# Patient Record
Sex: Female | Born: 1995 | Race: Black or African American | Hispanic: No | Smoking: Never smoker
Health system: Southern US, Community
[De-identification: ages and names within clinical notes are randomized; demographics above are authoritative.]

---

## 2019-12-14 ENCOUNTER — Other Ambulatory Visit: Payer: Self-pay

## 2019-12-14 ENCOUNTER — Ambulatory Visit (HOSPITAL_COMMUNITY)
Admission: EM | Admit: 2019-12-14 | Discharge: 2019-12-14 | Disposition: A | Discharge status: Home / Self Care | Payer: 59 | Attending: Family Medicine | Admitting: Family Medicine

## 2019-12-14 ENCOUNTER — Encounter (HOSPITAL_COMMUNITY): Payer: Self-pay

## 2019-12-14 DIAGNOSIS — R519 Headache, unspecified: Secondary | ICD-10-CM | POA: Insufficient documentation

## 2019-12-14 DIAGNOSIS — R42 Dizziness and giddiness: Secondary | ICD-10-CM | POA: Diagnosis present

## 2019-12-14 LAB — CBC
HCT: 40.8 % (ref 36.0–46.0)
Hemoglobin: 13.2 g/dL (ref 12.0–15.0)
MCH: 29 pg (ref 26.0–34.0)
MCHC: 32.4 g/dL (ref 30.0–36.0)
MCV: 89.7 fL (ref 80.0–100.0)
Platelets: 293 10*3/uL (ref 150–400)
RBC: 4.55 MIL/uL (ref 3.87–5.11)
RDW: 13 % (ref 11.5–15.5)
WBC: 10.2 10*3/uL (ref 4.0–10.5)
nRBC: 0 % (ref 0.0–0.2)

## 2019-12-14 MED ORDER — IBUPROFEN 800 MG PO TABS
ORAL_TABLET | ORAL | Status: AC
  Filled 2019-12-14: qty 1

## 2019-12-14 MED ORDER — ONDANSETRON 4 MG PO TBDP
ORAL_TABLET | ORAL | Status: AC
  Filled 2019-12-14: qty 1

## 2019-12-14 MED ORDER — IBUPROFEN 800 MG PO TABS
800.0000 mg | ORAL_TABLET | Freq: Once | ORAL | Status: AC
  Administered 2019-12-14: 800 mg via ORAL

## 2019-12-14 MED ORDER — ONDANSETRON 4 MG PO TBDP
4.0000 mg | ORAL_TABLET | Freq: Once | ORAL | Status: AC
  Administered 2019-12-14: 4 mg via ORAL

## 2019-12-14 MED ORDER — ONDANSETRON 4 MG PO TBDP: 4.0000 mg | ORAL_TABLET | Freq: Three times a day (TID) | ORAL | 0 refills | Status: AC | PRN

## 2019-12-14 MED ORDER — BUTALBITAL-APAP-CAFFEINE 50-325-40 MG PO TABS: 1.0000 | ORAL_TABLET | Freq: Four times a day (QID) | ORAL | 0 refills | Status: AC | PRN

## 2019-12-14 NOTE — Discharge Instructions (Signed)
Change in sleep hygeine, diet, activity habits can trigger headaches.  Zofran every 8 hours as needed for nausea or vomiting.   Fioriect as needed for severe headache, may cause drowsiness be mindful of this.  We will check your blood levels to ensure anemia isn't contributing to your lightheadedness. We will call you if this results concerning.  Drink plenty of water to ensure adequate hydration.  Please follow up with a primary care provider as you may need further evaluation if your headache's persist.

## 2019-12-14 NOTE — ED Provider Notes (Signed)
Christina Chaney    CSN: [id number] Arrival date & time: 12/14/19  1225      History   Chief Complaint Chief Complaint  Patient presents with  . Headache    HPI Christina Chaney is a 24 y.o. female.   Christina Chaney presents with complaints of headache for the past 1.5 month. Waxes and wanes. Can be worse when she first wakes from nap or from her night time sleep. Today feels constant and throbbing. The quality of the pain has been variable. Has tried tylenol and ibuprofen which haven't helped. Hasn't taken today. No head injury. Pain currently 4/10. Mild nausea associated with and occasionally will vomit due to pain. Last night with one emesis. Some light sensitivity . No sound sensitivity. She has had headache's in the past but feels like this has become more frequent. No vision change. No recent travel. She has a nexplanon. Denies any increase stress, no recent diet or activity changes. Has had changes to her sleep habits, her 31 year old son has had poor sleep and sometimes she gets only 3 hours a night of sleep. No dizziness but complains that she recently has intermittently felt lightheaded. Primarily with position changes such as bending to standing or standing too quickly. LMP was last week and was normal for her. No numbness tingling or weakness to extremities. No chest pain  Or palpitations. Doesn't have a PCP.     ROS per HPI, negative if not otherwise mentioned.      History reviewed. No pertinent past medical history.  There are no problems to display for this patient.   History reviewed. No pertinent surgical history.  OB History   No obstetric history on file.      Home Medications    Prior to Admission medications   Medication Sig Start Date End Date Taking? Authorizing Provider  acetaminophen (TYLENOL) 500 MG tablet Take 500 mg by mouth every 6 (six) hours as needed.   Yes [provider]  etonogestrel (NEXPLANON) 68 MG IMPL implant 1  each by Subdermal route once.   Yes [provider]  ibuprofen (ADVIL) 200 MG tablet Take 200 mg by mouth every 6 (six) hours as needed.   Yes [provider]  butalbital-acetaminophen-caffeine (FIORICET) 50-325-40 MG tablet Take 1 tablet by mouth every 6 (six) hours as needed for headache. 12/14/19 12/13/20  Zigmund Gottron, NP  ondansetron (ZOFRAN-ODT) 4 MG disintegrating tablet Take 1 tablet (4 mg total) by mouth every 8 (eight) hours as needed for nausea or vomiting. 12/14/19   Zigmund Gottron, NP    Family History Family History  Problem Relation Age of Onset  . Healthy Mother   . Healthy Father     Social History Social History   Tobacco Use  . Smoking status: Never Smoker  . Smokeless tobacco: Never Used  Substance Use Topics  . Alcohol use: Yes  . Drug use: Never     Allergies   Patient has no known allergies.   Review of Systems Review of Systems   Physical Exam Triage Vital Signs ED Triage Vitals  Enc Vitals Group     BP 12/14/19 1318 108/63     Pulse Rate 12/14/19 1318 87     Resp 12/14/19 1318 18     Temp 12/14/19 1318 98.1 F (36.7 C)     Temp Source 12/14/19 1318 Oral     SpO2 12/14/19 1318 100 %     Weight --  Height --      Head Circumference --      Peak Flow --      Pain Score 12/14/19 1315 4     Pain Loc --      Pain Edu? --      Excl. in GC? --    No data found.  Updated Vital Signs BP 108/63 (BP Location: Right Arm)   Pulse 87   Temp 98.1 F (36.7 C) (Oral)   Resp 18   LMP  (Within Days)   SpO2 100%    Physical Exam Constitutional:      General: She is not in acute distress.    Appearance: She is well-developed.  HENT:     Head: Normocephalic and atraumatic.  Eyes:     General: No visual field deficit.    Extraocular Movements: Extraocular movements intact.     Right eye: Normal extraocular motion.     Left eye: Normal extraocular motion.     Pupils: Pupils are equal, round, and reactive to light.    Cardiovascular:     Rate and Rhythm: Normal rate.  Pulmonary:     Effort: Pulmonary effort is normal.  Skin:    General: Skin is warm and dry.  Neurological:     Mental Status: She is alert and oriented to person, place, and time.     Cranial Nerves: No cranial nerve deficit or facial asymmetry.     Sensory: No sensory deficit.     Motor: No weakness.     Coordination: Coordination normal.     Gait: Gait normal.  Psychiatric:        Mood and Affect: Mood normal.        Speech: Speech normal.        Behavior: Behavior normal.      UC Treatments / Results  Labs (all labs ordered are listed, but only abnormal results are displayed) Labs Reviewed  CBC    EKG   Radiology No results found.  Procedures Procedures (including critical care time)  Medications Ordered in UC Medications  ibuprofen (ADVIL) tablet 800 mg (800 mg Oral Given 12/14/19 1351)  ondansetron (ZOFRAN-ODT) disintegrating tablet 4 mg (4 mg Oral Given 12/14/19 1351)    Initial Impression / Assessment and Plan / UC Course  I have reviewed the triage vital signs and the nursing notes.  Pertinent labs & imaging results that were available during my care of the patient were reviewed by me and considered in my medical decision making (see chart for details).     No red flag findings on exam. Persistent headache. Suspect poor sleep recently could be contributing. Intermittent light headedness, cbc obtained. Encouraged hydration. LMP last week normal and has a nexplanon in place. Declines toradol today with ibuprofen and zofran provided with zofran prn to pharmacy as well as fioricet. Encouraged follow up with PCP as may need further evaluation or neurology follow up if persistent. Return precautions provided. Patient verbalized understanding and agreeable to plan.  Ambulatory out of clinic without difficulty.    Final Clinical Impressions(s) / UC Diagnoses   Final diagnoses:  Bad headache  Positional  lightheadedness     Discharge Instructions     Change in sleep hygeine, diet, activity habits can trigger headaches.  Zofran every 8 hours as needed for nausea or vomiting.   Fioriect as needed for severe headache, may cause drowsiness be mindful of this.  We will check your blood levels to ensure anemia isn't contributing  to your lightheadedness. We will call you if this results concerning.  Drink plenty of water to ensure adequate hydration.  Please follow up with a primary care provider as you may need further evaluation if your headache's persist.     ED Prescriptions    Medication Sig Dispense Auth. Provider   ondansetron (ZOFRAN-ODT) 4 MG disintegrating tablet Take 1 tablet (4 mg total) by mouth every 8 (eight) hours as needed for nausea or vomiting. 12 tablet Linus Mako B, NP   butalbital-acetaminophen-caffeine (FIORICET) 50-325-40 MG tablet Take 1 tablet by mouth every 6 (six) hours as needed for headache. 10 tablet Georgetta Haber, NP     PDMP not reviewed this encounter.   Georgetta Haber, NP 12/14/19 1426

## 2019-12-14 NOTE — ED Triage Notes (Signed)
Pt presents with interminably headache x 1 month. Pt reports is worse when she wakes up. Pt taking ibuprofen and Tylenol without relief.

## 2020-04-11 ENCOUNTER — Encounter (HOSPITAL_COMMUNITY): Payer: Self-pay | Admitting: Emergency Medicine

## 2020-04-11 ENCOUNTER — Emergency Department (HOSPITAL_COMMUNITY): Payer: No Typology Code available for payment source

## 2020-04-11 ENCOUNTER — Other Ambulatory Visit: Payer: Self-pay

## 2020-04-11 ENCOUNTER — Emergency Department (HOSPITAL_COMMUNITY)
Admission: EM | Admit: 2020-04-11 | Discharge: 2020-04-11 | Disposition: A | Discharge status: Home / Self Care | Payer: No Typology Code available for payment source | Attending: Emergency Medicine | Admitting: Emergency Medicine

## 2020-04-11 DIAGNOSIS — S161XXA Strain of muscle, fascia and tendon at neck level, initial encounter: Secondary | ICD-10-CM | POA: Diagnosis not present

## 2020-04-11 DIAGNOSIS — Y929 Unspecified place or not applicable: Secondary | ICD-10-CM | POA: Insufficient documentation

## 2020-04-11 DIAGNOSIS — S46912A Strain of unspecified muscle, fascia and tendon at shoulder and upper arm level, left arm, initial encounter: Secondary | ICD-10-CM | POA: Insufficient documentation

## 2020-04-11 DIAGNOSIS — Y999 Unspecified external cause status: Secondary | ICD-10-CM | POA: Insufficient documentation

## 2020-04-11 DIAGNOSIS — Y939 Activity, unspecified: Secondary | ICD-10-CM | POA: Insufficient documentation

## 2020-04-11 DIAGNOSIS — M542 Cervicalgia: Secondary | ICD-10-CM | POA: Diagnosis present

## 2020-04-11 DIAGNOSIS — T148XXA Other injury of unspecified body region, initial encounter: Secondary | ICD-10-CM

## 2020-04-11 MED ORDER — HYDROCODONE-ACETAMINOPHEN 5-325 MG PO TABS
1.0000 | ORAL_TABLET | Freq: Once | ORAL | Status: AC
  Administered 2020-04-11: 1 via ORAL
  Filled 2020-04-11: qty 1

## 2020-04-11 MED ORDER — HYDROCODONE-ACETAMINOPHEN 5-325 MG PO TABS: 1.0000 | ORAL_TABLET | ORAL | 0 refills | Status: AC | PRN
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
